# Patient Record
Sex: Male | Born: 2000 | Hispanic: No | Marital: Single | Smoking: Current some day smoker
Health system: Southern US, Community
[De-identification: ages and names within clinical notes are randomized; demographics above are authoritative.]

## PROBLEM LIST (undated history)

## (undated) DIAGNOSIS — S42302A Unspecified fracture of shaft of humerus, left arm, initial encounter for closed fracture: Secondary | ICD-10-CM

## (undated) HISTORY — PX: TYMPANOSTOMY TUBE PLACEMENT: SHX32

## (undated) HISTORY — DX: Unspecified fracture of shaft of humerus, left arm, initial encounter for closed fracture: S42.302A

---
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
## 2022-12-31 ENCOUNTER — Ambulatory Visit (HOSPITAL_COMMUNITY)
Admission: EM | Admit: 2022-12-31 | Discharge: 2022-12-31 | Disposition: A | Discharge status: Home / Self Care | Payer: Self-pay | Attending: Physician Assistant | Admitting: Physician Assistant

## 2022-12-31 ENCOUNTER — Encounter (HOSPITAL_COMMUNITY): Payer: Self-pay

## 2022-12-31 ENCOUNTER — Ambulatory Visit (INDEPENDENT_AMBULATORY_CARE_PROVIDER_SITE_OTHER): Payer: Self-pay

## 2022-12-31 DIAGNOSIS — S52124A Nondisplaced fracture of head of right radius, initial encounter for closed fracture: Secondary | ICD-10-CM

## 2022-12-31 NOTE — ED Triage Notes (Signed)
Patient was on a long board today and fell.Patient states he landed on his right upper arm and right knee abrasions.  Patient denies hitting his head.  Patient states he had Advil 400 mg 1 1/2 hours ago.

## 2022-12-31 NOTE — ED Provider Notes (Signed)
[facility]    CSN: [id number] Arrival date & time: 12/31/22  1537      History   Chief Complaint Chief Complaint  Patient presents with   Arm Injury    HPI [name] is a 22 y.o. male.   Patient presents with right elbow and forearm pain after he was riding a long board earlier today and fell landing on his right arm.  He also has an abrasion to his right knee.  He denies hitting his head or loss of consciousness.  He has been taking Advil 400 mg today with minimal improvement.  He has no previous injuries to the right arm.      Past Medical History:  Diagnosis Date   Arm fracture, left     Patient Active Problem List   Diagnosis Date Noted   Gynecomastia, male 10/23/2017    Past Surgical History:  Procedure Laterality Date   TYMPANOSTOMY TUBE PLACEMENT         Home Medications    Prior to Admission medications   Not on File    Family History Family History  Problem Relation Age of Onset   Thyroid disease Maternal Grandmother    Diabetes Maternal Grandfather    Hypertension Paternal Grandmother     Social History Social History   Tobacco Use   Smoking status: Some Days    Types: Cigarettes   Smokeless tobacco: Never  Vaping Use   Vaping Use: Never used  Substance Use Topics   Alcohol use: No   Drug use: No     Allergies   Patient has no known allergies.   Review of Systems Review of Systems  Constitutional:  Negative for chills and fever.  HENT:  Negative for ear pain and sore throat.   Eyes:  Negative for pain and visual disturbance.  Respiratory:  Negative for cough and shortness of breath.   Cardiovascular:  Negative for chest pain and palpitations.  Gastrointestinal:  Negative for abdominal pain and vomiting.  Genitourinary:  Negative for dysuria and hematuria.  Musculoskeletal:  Positive for arthralgias. Negative for back pain.  Skin:  Negative for color change and rash.  Neurological:  Negative for  seizures and syncope.  All other systems reviewed and are negative.    Physical Exam Triage Vital Signs ED Triage Vitals  Enc Vitals Group     BP 12/31/22 1641 115/72     Pulse Rate 12/31/22 1641 93     Resp 12/31/22 1641 14     Temp 12/31/22 1641 98.1 F (36.7 C)     Temp Source 12/31/22 1641 Oral     SpO2 12/31/22 1641 99 %     Weight --      Height --      Head Circumference --      Peak Flow --      Pain Score 12/31/22 1644 6     Pain Loc --      Pain Edu? --      Excl. in GC? --    No data found.  Updated Vital Signs BP 115/72 (BP Location: Left Arm)   Pulse 93   Temp 98.1 F (36.7 C) (Oral)   Resp 14   SpO2 99%   Visual Acuity Right Eye Distance:   Left Eye Distance:   Bilateral Distance:    Right Eye Near:   Left Eye Near:    Bilateral Near:     Physical Exam Vitals and nursing note reviewed.  Constitutional:      General: He is not in acute distress.    Appearance: He is well-developed.  HENT:     Head: Normocephalic and atraumatic.  Eyes:     Conjunctiva/sclera: Conjunctivae normal.  Cardiovascular:     Rate and Rhythm: Normal rate and regular rhythm.     Heart sounds: No murmur heard. Pulmonary:     Effort: Pulmonary effort is normal. No respiratory distress.     Breath sounds: Normal breath sounds.  Abdominal:     Palpations: Abdomen is soft.     Tenderness: There is no abdominal tenderness.  Musculoskeletal:        General: No swelling.     Cervical back: Neck supple.     Comments: Tenderness to palpation right olecranon.  Pain with flexion extension of right elbow.  Normal range of motion of right shoulder.  No tenderness to palpation.  Wrist with normal range of motion no tenderness to palpation.  Skin:    General: Skin is warm and dry.     Capillary Refill: Capillary refill takes less than 2 seconds.  Neurological:     Mental Status: He is alert.  Psychiatric:        Mood and Affect: Mood normal.      UC Treatments / Results   Labs (all labs ordered are listed, but only abnormal results are displayed) Labs Reviewed - No data to display  EKG   Radiology DG Elbow Complete Right  Result Date: 12/31/2022 CLINICAL DATA:  Status post fall. EXAM: RIGHT ELBOW - COMPLETE 3+ VIEW COMPARISON:  None Available. FINDINGS: Elevation of the anterior and posterior fat pads of the elbow noted compatible with underlying joint effusion. Nondisplaced, intra-articular fracture involving the radial head identified. No signs of acute did scratch set no dislocation or additional fractures identified. IMPRESSION: 1. Nondisplaced, intra-articular fracture of the radial head. 2. Joint effusion. Electronically Signed   By: Signa Kell M.D.   On: 12/31/2022 17:31    Procedures Procedures (including critical care time)  Medications Ordered in UC Medications - No data to display  Initial Impression / Assessment and Plan / UC Course  I have reviewed the triage vital signs and the nursing notes.  Pertinent labs & imaging results that were available during my care of the patient were reviewed by me and considered in my medical decision making (see chart for details).     Nondisplaced fracture of radial head right.  Splinted in clinic today.  Advised to follow-up with orthopedics tomorrow.  Supportive care discussed. Final Clinical Impressions(s) / UC Diagnoses   Final diagnoses:  None     Discharge Instructions      Follow-up with orthopedics, call tomorrow. Wear splint until you are seen by orthopedics. Recommend ibuprofen or Tylenol as needed for pain and discomfort.     ED Prescriptions   None    PDMP not reviewed this encounter.   Ward, Tylene Fantasia, PA-C 12/31/22 1745

## 2022-12-31 NOTE — Discharge Instructions (Addendum)
Follow-up with orthopedics, call tomorrow. Wear splint until you are seen by orthopedics. Recommend ibuprofen or Tylenol as needed for pain and discomfort.
